# Patient Record
Sex: Male | Born: 1940 | Race: White | Hispanic: No | Smoking: Never smoker
Health system: Southern US, Community
[De-identification: ages and names within clinical notes are randomized; demographics above are authoritative.]

## PROBLEM LIST (undated history)

## (undated) DIAGNOSIS — E785 Hyperlipidemia, unspecified: Secondary | ICD-10-CM

## (undated) DIAGNOSIS — I10 Essential (primary) hypertension: Secondary | ICD-10-CM

## (undated) HISTORY — DX: Hyperlipidemia, unspecified: E78.5

## (undated) HISTORY — DX: Essential (primary) hypertension: I10

---

## 2002-01-24 ENCOUNTER — Emergency Department (HOSPITAL_COMMUNITY): Admission: EM | Admit: 2002-01-24 | Discharge: 2002-01-25 | Payer: Self-pay | Admitting: Emergency Medicine

## 2002-01-25 ENCOUNTER — Encounter: Payer: Self-pay | Admitting: Emergency Medicine

## 2003-02-23 ENCOUNTER — Inpatient Hospital Stay (HOSPITAL_COMMUNITY): Admission: EM | Admit: 2003-02-23 | Discharge: 2003-02-24 | Payer: Self-pay | Admitting: Emergency Medicine

## 2004-09-14 ENCOUNTER — Ambulatory Visit (HOSPITAL_COMMUNITY): Admission: RE | Admit: 2004-09-14 | Discharge: 2004-09-14 | Payer: Self-pay | Admitting: Gastroenterology

## 2013-09-20 ENCOUNTER — Encounter: Payer: Self-pay | Admitting: *Deleted

## 2014-02-26 DIAGNOSIS — Z79899 Other long term (current) drug therapy: Secondary | ICD-10-CM | POA: Diagnosis not present

## 2014-02-26 DIAGNOSIS — H524 Presbyopia: Secondary | ICD-10-CM | POA: Diagnosis not present

## 2014-02-26 DIAGNOSIS — H43393 Other vitreous opacities, bilateral: Secondary | ICD-10-CM | POA: Diagnosis not present

## 2014-02-26 DIAGNOSIS — I10 Essential (primary) hypertension: Secondary | ICD-10-CM | POA: Diagnosis not present

## 2014-02-26 DIAGNOSIS — Z1389 Encounter for screening for other disorder: Secondary | ICD-10-CM | POA: Diagnosis not present

## 2014-02-26 DIAGNOSIS — R739 Hyperglycemia, unspecified: Secondary | ICD-10-CM | POA: Diagnosis not present

## 2014-02-26 DIAGNOSIS — E780 Pure hypercholesterolemia: Secondary | ICD-10-CM | POA: Diagnosis not present

## 2014-02-26 DIAGNOSIS — H2513 Age-related nuclear cataract, bilateral: Secondary | ICD-10-CM | POA: Diagnosis not present

## 2014-02-26 DIAGNOSIS — Z0000 Encounter for general adult medical examination without abnormal findings: Secondary | ICD-10-CM | POA: Diagnosis not present

## 2014-02-26 DIAGNOSIS — H35373 Puckering of macula, bilateral: Secondary | ICD-10-CM | POA: Diagnosis not present

## 2014-04-30 DIAGNOSIS — I10 Essential (primary) hypertension: Secondary | ICD-10-CM | POA: Diagnosis not present

## 2014-06-27 DIAGNOSIS — D1801 Hemangioma of skin and subcutaneous tissue: Secondary | ICD-10-CM | POA: Diagnosis not present

## 2014-06-27 DIAGNOSIS — L570 Actinic keratosis: Secondary | ICD-10-CM | POA: Diagnosis not present

## 2014-06-27 DIAGNOSIS — Z85828 Personal history of other malignant neoplasm of skin: Secondary | ICD-10-CM | POA: Diagnosis not present

## 2014-06-27 DIAGNOSIS — L821 Other seborrheic keratosis: Secondary | ICD-10-CM | POA: Diagnosis not present

## 2014-06-27 DIAGNOSIS — D485 Neoplasm of uncertain behavior of skin: Secondary | ICD-10-CM | POA: Diagnosis not present

## 2015-01-01 DIAGNOSIS — Z85828 Personal history of other malignant neoplasm of skin: Secondary | ICD-10-CM | POA: Diagnosis not present

## 2015-01-01 DIAGNOSIS — D485 Neoplasm of uncertain behavior of skin: Secondary | ICD-10-CM | POA: Diagnosis not present

## 2015-01-01 DIAGNOSIS — L812 Freckles: Secondary | ICD-10-CM | POA: Diagnosis not present

## 2015-01-01 DIAGNOSIS — L570 Actinic keratosis: Secondary | ICD-10-CM | POA: Diagnosis not present

## 2015-01-01 DIAGNOSIS — L821 Other seborrheic keratosis: Secondary | ICD-10-CM | POA: Diagnosis not present

## 2015-01-01 DIAGNOSIS — L820 Inflamed seborrheic keratosis: Secondary | ICD-10-CM | POA: Diagnosis not present

## 2015-01-01 DIAGNOSIS — D1801 Hemangioma of skin and subcutaneous tissue: Secondary | ICD-10-CM | POA: Diagnosis not present

## 2015-01-01 DIAGNOSIS — C4441 Basal cell carcinoma of skin of scalp and neck: Secondary | ICD-10-CM | POA: Diagnosis not present

## 2015-03-13 ENCOUNTER — Emergency Department (HOSPITAL_COMMUNITY)
Admission: EM | Admit: 2015-03-13 | Discharge: 2015-03-13 | Disposition: A | Discharge status: Home / Self Care | Payer: Commercial Managed Care - HMO | Attending: Emergency Medicine | Admitting: Emergency Medicine

## 2015-03-13 ENCOUNTER — Encounter (HOSPITAL_COMMUNITY): Payer: Self-pay | Admitting: Emergency Medicine

## 2015-03-13 DIAGNOSIS — Z7982 Long term (current) use of aspirin: Secondary | ICD-10-CM | POA: Insufficient documentation

## 2015-03-13 DIAGNOSIS — Z8639 Personal history of other endocrine, nutritional and metabolic disease: Secondary | ICD-10-CM | POA: Diagnosis not present

## 2015-03-13 DIAGNOSIS — I10 Essential (primary) hypertension: Secondary | ICD-10-CM | POA: Insufficient documentation

## 2015-03-13 DIAGNOSIS — T464X5A Adverse effect of angiotensin-converting-enzyme inhibitors, initial encounter: Secondary | ICD-10-CM | POA: Insufficient documentation

## 2015-03-13 DIAGNOSIS — Z79899 Other long term (current) drug therapy: Secondary | ICD-10-CM | POA: Insufficient documentation

## 2015-03-13 DIAGNOSIS — T783XXA Angioneurotic edema, initial encounter: Secondary | ICD-10-CM | POA: Insufficient documentation

## 2015-03-13 MED ORDER — FAMOTIDINE IN NACL 20-0.9 MG/50ML-% IV SOLN
20.0000 mg | Freq: Once | INTRAVENOUS | Status: AC
  Administered 2015-03-13: 20 mg via INTRAVENOUS
  Filled 2015-03-13: qty 50

## 2015-03-13 MED ORDER — METHYLPREDNISOLONE SODIUM SUCC 125 MG IJ SOLR
125.0000 mg | Freq: Once | INTRAMUSCULAR | Status: AC
  Administered 2015-03-13: 125 mg via INTRAVENOUS
  Filled 2015-03-13: qty 2

## 2015-03-13 MED ORDER — DIPHENHYDRAMINE HCL 50 MG/ML IJ SOLN
25.0000 mg | Freq: Once | INTRAMUSCULAR | Status: AC
  Administered 2015-03-13: 25 mg via INTRAVENOUS
  Filled 2015-03-13: qty 1

## 2015-03-13 NOTE — ED Notes (Signed)
Pt states he woke up at 4am this morning and his face felt funny  Pt states he thought he had slept funny but when he got up his face was swollen  Pt state he put ice on it but it got worse  Pt states he takes lisinopril

## 2015-03-13 NOTE — ED Notes (Signed)
Pt has taken 2 benadryl prior to visit

## 2015-03-13 NOTE — Discharge Instructions (Signed)
Angioedema  Angioedema is a sudden swelling of tissues, often of the skin. It can occur on the face or genitals or in the abdomen or other body parts. The swelling usually develops over a short period and gets better in 24 to 48 hours. It often begins during the night and is found when the person wakes up. The person may also get red, itchy patches of skin (hives). Angioedema can be dangerous if it involves swelling of the air passages.   Depending on the cause, episodes of angioedema may only happen once, come back in unpredictable patterns, or repeat for several years and then gradually fade away.   CAUSES   Angioedema can be caused by an allergic reaction to various triggers. It can also result from nonallergic causes, including reactions to drugs, immune system disorders, viral infections, or an abnormal gene that is passed to you from your parents (hereditary). For some people with angioedema, the cause is unknown.   Some things that can trigger angioedema include:    Foods.    Medicines, such as ACE inhibitors, ARBs, nonsteroidal anti-inflammatory agents, or estrogen.    Latex.    Animal saliva.    Insect stings.    Dyes used in X-rays.    Mild injury.    Dental work.   Surgery.   Stress.    Sudden changes in temperature.    Exercise.  SIGNS AND SYMPTOMS    Swelling of the skin.   Hives. If these are present, there is also intense itching.   Redness in the affected area.    Pain in the affected area.   Swollen lips or tongue.   Breathing problems. This may happen if the air passages swell.   Wheezing.  If internal organs are involved, there may be:    Nausea.    Abdominal pain.    Vomiting.    Difficulty swallowing.    Difficulty passing urine.  DIAGNOSIS    Your health care provider will examine the affected area and take a medical and family history.   Various tests may be done to help determine the cause. Tests may include:   Allergy skin tests to see if the problem  is an allergic reaction.    Blood tests to check for hereditary angioedema.    Tests to check for underlying diseases that could cause the condition.    A review of your medicines, including over-the-counter medicines, may be done.  TREATMENT   Treatment will depend on the cause of the angioedema. Possible treatments include:    Removal of anything that triggered the condition (such as stopping certain medicines).    Medicines to treat symptoms or prevent attacks. Medicines given may include:     Antihistamines.     Epinephrine injection.     Steroids.    Hospitalization may be required for severe attacks. If the air passages are affected, it can be an emergency. Tubes may need to be placed to keep the airway open.  HOME CARE INSTRUCTIONS    Take all medicines as directed by your health care provider.   If you were given medicines for emergency allergy treatment, always carry them with you.   Wear a medical bracelet as directed by your health care provider.    Avoid known triggers.  SEEK MEDICAL CARE IF:    You have repeat attacks of angioedema.    Your attacks are more frequent or more severe despite preventive measures.    You have hereditary angioedema   and are considering having children. It is important to discuss with your health care provider the risks of passing the condition on to your children.  SEEK IMMEDIATE MEDICAL CARE IF:    You have severe swelling of the mouth, tongue, or lips.   You have difficulty breathing.    You have difficulty swallowing.    You faint.  MAKE SURE YOU:   Understand these instructions.   Will watch your condition.   Will get help right away if you are not doing well or get worse.     This information is not intended to replace advice given to you by your health care provider. Make sure you discuss any questions you have with your health care provider.     Document Released: 03/14/2001 Document Revised: 01/24/2014 Document Reviewed:  08/27/2012  [employer]vier Interactive Patient Education 2016 [employer]

## 2015-03-13 NOTE — ED Provider Notes (Signed)
CSN: [id number]     Arrival date & time 03/13/15  [id number] History   First MD Initiated Contact with Patient 03/13/15 0703     Chief Complaint  Patient presents with  . Angioedema      HPI Pt reports awakening with swelling of the right cheek progressing to swelling of the lips. No difficulty breathing or swallowing. No tongue swelling. Reports tongue swelling several weeks ago that resolved with ice. Has been on lisinopril for several years. No other complaints. Swelling is moderate in severity   Past Medical History  Diagnosis Date  . Hyperlipidemia   . Hypertension    History reviewed. No pertinent past surgical history. No family history on file. Social History  Substance Use Topics  . Smoking status: Never Smoker   . Smokeless tobacco: Never Used  . Alcohol Use: Yes    Review of Systems  All other systems reviewed and are negative.     Allergies  Review of patient's allergies indicates no known allergies.  Home Medications   Prior to Admission medications   Medication Sig Start Date End Date Taking? Authorizing Provider  aspirin 81 MG tablet Take 81 mg by mouth daily.    Historical Provider, MD  famotidine (PEPCID AC) 10 MG chewable tablet Chew 10 mg by mouth as needed for heartburn.    Historical Provider, MD  hydrochlorothiazide (HYDRODIURIL) 25 MG tablet Take 25 mg by mouth daily.    Historical Provider, MD  lisinopril (PRINIVIL,ZESTRIL) 10 MG tablet Take 10 mg by mouth daily.    Historical Provider, MD  Multiple Vitamins-Minerals (MULTIVITAMIN WITH MINERALS) tablet Take 1 tablet by mouth daily.    Historical Provider, MD   BP 156/75 mmHg  Pulse 63  Temp(Src) 97.9 F (36.6 C) (Oral)  Resp 18  Ht 5\' 10"  (1.778 m)  Wt 185 lb (83.915 kg)  BMI 26.54 kg/m2  SpO2 99% Physical Exam  Constitutional: He is oriented to person, place, and time. He appears well-developed and well-nourished.  HENT:  Upper and lower lip swelling with swelling of the right buccal  mucosa. No swelling of the tongue or posterior pharynx. Tolerating secreations  Eyes: EOM are normal.  Neck: Normal range of motion. Neck supple.  Pulmonary/Chest: Effort normal. No stridor. No respiratory distress.  Abdominal: He exhibits no distension.  Musculoskeletal: Normal range of motion.  Neurological: He is alert and oriented to person, place, and time.  Psychiatric: He has a normal mood and affect.  Nursing note and vitals reviewed.   ED Course  Procedures (including critical care time) Labs Review Labs Reviewed - No data to display  Imaging Review No results found. I have personally reviewed and evaluated these images and lab results as part of my medical decision-making.   EKG Interpretation None      MDM   Final diagnoses:  Angioedema, initial encounter    10:53 AM Patient observed in the emergency department for approximately 4 hours.  His swelling has significantly improved.  Discharge home in good condition.  Patient's lisinopril will be discontinued.  A vas that he follow-up with his doctor regarding an additional blood pressure medication.    Jola Schmidt, MD 03/13/15 1054

## 2015-03-13 NOTE — ED Notes (Signed)
Pt complaining of itching around his neck as well.

## 2015-03-16 DIAGNOSIS — I10 Essential (primary) hypertension: Secondary | ICD-10-CM | POA: Diagnosis not present

## 2015-03-16 DIAGNOSIS — T783XXA Angioneurotic edema, initial encounter: Secondary | ICD-10-CM | POA: Diagnosis not present

## 2015-05-06 DIAGNOSIS — I10 Essential (primary) hypertension: Secondary | ICD-10-CM | POA: Diagnosis not present

## 2015-05-06 DIAGNOSIS — Z79899 Other long term (current) drug therapy: Secondary | ICD-10-CM | POA: Diagnosis not present

## 2015-05-06 DIAGNOSIS — Z0000 Encounter for general adult medical examination without abnormal findings: Secondary | ICD-10-CM | POA: Diagnosis not present

## 2015-05-06 DIAGNOSIS — M545 Low back pain: Secondary | ICD-10-CM | POA: Diagnosis not present

## 2015-05-06 DIAGNOSIS — E7800 Pure hypercholesterolemia, unspecified: Secondary | ICD-10-CM | POA: Diagnosis not present

## 2015-05-06 DIAGNOSIS — G8929 Other chronic pain: Secondary | ICD-10-CM | POA: Diagnosis not present

## 2015-05-06 DIAGNOSIS — Z1389 Encounter for screening for other disorder: Secondary | ICD-10-CM | POA: Diagnosis not present

## 2015-06-29 DIAGNOSIS — D485 Neoplasm of uncertain behavior of skin: Secondary | ICD-10-CM | POA: Diagnosis not present

## 2015-06-29 DIAGNOSIS — L820 Inflamed seborrheic keratosis: Secondary | ICD-10-CM | POA: Diagnosis not present

## 2015-06-29 DIAGNOSIS — C44519 Basal cell carcinoma of skin of other part of trunk: Secondary | ICD-10-CM | POA: Diagnosis not present

## 2015-06-29 DIAGNOSIS — L821 Other seborrheic keratosis: Secondary | ICD-10-CM | POA: Diagnosis not present

## 2015-06-29 DIAGNOSIS — L570 Actinic keratosis: Secondary | ICD-10-CM | POA: Diagnosis not present

## 2015-06-29 DIAGNOSIS — Z85828 Personal history of other malignant neoplasm of skin: Secondary | ICD-10-CM | POA: Diagnosis not present

## 2015-12-28 DIAGNOSIS — L812 Freckles: Secondary | ICD-10-CM | POA: Diagnosis not present

## 2015-12-28 DIAGNOSIS — D1801 Hemangioma of skin and subcutaneous tissue: Secondary | ICD-10-CM | POA: Diagnosis not present

## 2015-12-28 DIAGNOSIS — D225 Melanocytic nevi of trunk: Secondary | ICD-10-CM | POA: Diagnosis not present

## 2015-12-28 DIAGNOSIS — L820 Inflamed seborrheic keratosis: Secondary | ICD-10-CM | POA: Diagnosis not present

## 2015-12-28 DIAGNOSIS — L570 Actinic keratosis: Secondary | ICD-10-CM | POA: Diagnosis not present

## 2015-12-28 DIAGNOSIS — L821 Other seborrheic keratosis: Secondary | ICD-10-CM | POA: Diagnosis not present

## 2015-12-28 DIAGNOSIS — Z85828 Personal history of other malignant neoplasm of skin: Secondary | ICD-10-CM | POA: Diagnosis not present

## 2016-03-30 DIAGNOSIS — H25013 Cortical age-related cataract, bilateral: Secondary | ICD-10-CM | POA: Diagnosis not present

## 2016-03-30 DIAGNOSIS — H2513 Age-related nuclear cataract, bilateral: Secondary | ICD-10-CM | POA: Diagnosis not present

## 2016-03-30 DIAGNOSIS — H524 Presbyopia: Secondary | ICD-10-CM | POA: Diagnosis not present

## 2016-03-30 DIAGNOSIS — H35373 Puckering of macula, bilateral: Secondary | ICD-10-CM | POA: Diagnosis not present

## 2016-03-30 DIAGNOSIS — H53031 Strabismic amblyopia, right eye: Secondary | ICD-10-CM | POA: Diagnosis not present

## 2016-05-20 DIAGNOSIS — Z0000 Encounter for general adult medical examination without abnormal findings: Secondary | ICD-10-CM | POA: Diagnosis not present

## 2016-05-20 DIAGNOSIS — Z79899 Other long term (current) drug therapy: Secondary | ICD-10-CM | POA: Diagnosis not present

## 2016-05-20 DIAGNOSIS — Z1389 Encounter for screening for other disorder: Secondary | ICD-10-CM | POA: Diagnosis not present

## 2016-05-20 DIAGNOSIS — E7800 Pure hypercholesterolemia, unspecified: Secondary | ICD-10-CM | POA: Diagnosis not present

## 2016-05-20 DIAGNOSIS — I10 Essential (primary) hypertension: Secondary | ICD-10-CM | POA: Diagnosis not present

## 2016-05-20 DIAGNOSIS — R972 Elevated prostate specific antigen [PSA]: Secondary | ICD-10-CM | POA: Diagnosis not present

## 2016-06-20 DIAGNOSIS — L821 Other seborrheic keratosis: Secondary | ICD-10-CM | POA: Diagnosis not present

## 2016-06-20 DIAGNOSIS — D485 Neoplasm of uncertain behavior of skin: Secondary | ICD-10-CM | POA: Diagnosis not present

## 2016-06-20 DIAGNOSIS — L570 Actinic keratosis: Secondary | ICD-10-CM | POA: Diagnosis not present

## 2016-06-20 DIAGNOSIS — L812 Freckles: Secondary | ICD-10-CM | POA: Diagnosis not present

## 2016-06-20 DIAGNOSIS — L820 Inflamed seborrheic keratosis: Secondary | ICD-10-CM | POA: Diagnosis not present

## 2016-06-20 DIAGNOSIS — D1801 Hemangioma of skin and subcutaneous tissue: Secondary | ICD-10-CM | POA: Diagnosis not present

## 2016-06-20 DIAGNOSIS — Z85828 Personal history of other malignant neoplasm of skin: Secondary | ICD-10-CM | POA: Diagnosis not present

## 2016-06-20 DIAGNOSIS — C44319 Basal cell carcinoma of skin of other parts of face: Secondary | ICD-10-CM | POA: Diagnosis not present

## 2016-06-20 DIAGNOSIS — C44311 Basal cell carcinoma of skin of nose: Secondary | ICD-10-CM | POA: Diagnosis not present

## 2016-08-09 DIAGNOSIS — N401 Enlarged prostate with lower urinary tract symptoms: Secondary | ICD-10-CM | POA: Diagnosis not present

## 2016-08-09 DIAGNOSIS — R351 Nocturia: Secondary | ICD-10-CM | POA: Diagnosis not present

## 2016-08-09 DIAGNOSIS — R350 Frequency of micturition: Secondary | ICD-10-CM | POA: Diagnosis not present

## 2016-08-09 DIAGNOSIS — R3915 Urgency of urination: Secondary | ICD-10-CM | POA: Diagnosis not present

## 2016-08-09 DIAGNOSIS — N400 Enlarged prostate without lower urinary tract symptoms: Secondary | ICD-10-CM | POA: Diagnosis not present

## 2016-08-09 DIAGNOSIS — N138 Other obstructive and reflux uropathy: Secondary | ICD-10-CM | POA: Diagnosis not present

## 2016-08-09 DIAGNOSIS — R972 Elevated prostate specific antigen [PSA]: Secondary | ICD-10-CM | POA: Diagnosis not present

## 2016-08-18 DIAGNOSIS — H8113 Benign paroxysmal vertigo, bilateral: Secondary | ICD-10-CM | POA: Diagnosis not present

## 2017-01-18 DIAGNOSIS — I10 Essential (primary) hypertension: Secondary | ICD-10-CM | POA: Diagnosis not present

## 2017-01-18 DIAGNOSIS — Z23 Encounter for immunization: Secondary | ICD-10-CM | POA: Diagnosis not present

## 2017-01-18 DIAGNOSIS — S39012A Strain of muscle, fascia and tendon of lower back, initial encounter: Secondary | ICD-10-CM | POA: Diagnosis not present

## 2017-02-14 DIAGNOSIS — R972 Elevated prostate specific antigen [PSA]: Secondary | ICD-10-CM | POA: Diagnosis not present

## 2017-02-14 DIAGNOSIS — N400 Enlarged prostate without lower urinary tract symptoms: Secondary | ICD-10-CM | POA: Diagnosis not present

## 2017-02-14 DIAGNOSIS — K4090 Unilateral inguinal hernia, without obstruction or gangrene, not specified as recurrent: Secondary | ICD-10-CM | POA: Diagnosis not present

## 2017-03-10 DIAGNOSIS — M6208 Separation of muscle (nontraumatic), other site: Secondary | ICD-10-CM | POA: Diagnosis not present

## 2017-03-10 DIAGNOSIS — K4090 Unilateral inguinal hernia, without obstruction or gangrene, not specified as recurrent: Secondary | ICD-10-CM | POA: Diagnosis not present

## 2017-03-31 DIAGNOSIS — H35373 Puckering of macula, bilateral: Secondary | ICD-10-CM | POA: Diagnosis not present

## 2017-03-31 DIAGNOSIS — H2513 Age-related nuclear cataract, bilateral: Secondary | ICD-10-CM | POA: Diagnosis not present

## 2017-03-31 DIAGNOSIS — H25013 Cortical age-related cataract, bilateral: Secondary | ICD-10-CM | POA: Diagnosis not present

## 2017-04-27 DIAGNOSIS — K4090 Unilateral inguinal hernia, without obstruction or gangrene, not specified as recurrent: Secondary | ICD-10-CM | POA: Diagnosis not present

## 2017-05-31 DIAGNOSIS — Z1389 Encounter for screening for other disorder: Secondary | ICD-10-CM | POA: Diagnosis not present

## 2017-05-31 DIAGNOSIS — R739 Hyperglycemia, unspecified: Secondary | ICD-10-CM | POA: Diagnosis not present

## 2017-05-31 DIAGNOSIS — K219 Gastro-esophageal reflux disease without esophagitis: Secondary | ICD-10-CM | POA: Diagnosis not present

## 2017-05-31 DIAGNOSIS — I10 Essential (primary) hypertension: Secondary | ICD-10-CM | POA: Diagnosis not present

## 2017-05-31 DIAGNOSIS — E7800 Pure hypercholesterolemia, unspecified: Secondary | ICD-10-CM | POA: Diagnosis not present

## 2017-05-31 DIAGNOSIS — Z79899 Other long term (current) drug therapy: Secondary | ICD-10-CM | POA: Diagnosis not present

## 2017-05-31 DIAGNOSIS — Z0000 Encounter for general adult medical examination without abnormal findings: Secondary | ICD-10-CM | POA: Diagnosis not present

## 2017-08-15 DIAGNOSIS — N138 Other obstructive and reflux uropathy: Secondary | ICD-10-CM | POA: Diagnosis not present

## 2017-08-15 DIAGNOSIS — N401 Enlarged prostate with lower urinary tract symptoms: Secondary | ICD-10-CM | POA: Diagnosis not present

## 2017-08-15 DIAGNOSIS — N400 Enlarged prostate without lower urinary tract symptoms: Secondary | ICD-10-CM | POA: Diagnosis not present

## 2017-08-15 DIAGNOSIS — I10 Essential (primary) hypertension: Secondary | ICD-10-CM | POA: Diagnosis not present

## 2017-08-15 DIAGNOSIS — R972 Elevated prostate specific antigen [PSA]: Secondary | ICD-10-CM | POA: Diagnosis not present

## 2017-10-03 DIAGNOSIS — H2511 Age-related nuclear cataract, right eye: Secondary | ICD-10-CM | POA: Diagnosis not present

## 2017-10-03 DIAGNOSIS — H2513 Age-related nuclear cataract, bilateral: Secondary | ICD-10-CM | POA: Diagnosis not present

## 2017-10-03 DIAGNOSIS — H35371 Puckering of macula, right eye: Secondary | ICD-10-CM | POA: Diagnosis not present

## 2017-10-03 DIAGNOSIS — H35372 Puckering of macula, left eye: Secondary | ICD-10-CM | POA: Diagnosis not present

## 2017-10-03 DIAGNOSIS — H25013 Cortical age-related cataract, bilateral: Secondary | ICD-10-CM | POA: Diagnosis not present

## 2017-10-03 DIAGNOSIS — H35373 Puckering of macula, bilateral: Secondary | ICD-10-CM | POA: Diagnosis not present

## 2017-10-25 DIAGNOSIS — H2511 Age-related nuclear cataract, right eye: Secondary | ICD-10-CM | POA: Diagnosis not present

## 2017-10-25 DIAGNOSIS — H25811 Combined forms of age-related cataract, right eye: Secondary | ICD-10-CM | POA: Diagnosis not present

## 2017-11-23 DIAGNOSIS — H2512 Age-related nuclear cataract, left eye: Secondary | ICD-10-CM | POA: Diagnosis not present

## 2017-11-23 DIAGNOSIS — H25012 Cortical age-related cataract, left eye: Secondary | ICD-10-CM | POA: Diagnosis not present

## 2017-12-27 DIAGNOSIS — H2512 Age-related nuclear cataract, left eye: Secondary | ICD-10-CM | POA: Diagnosis not present

## 2017-12-27 DIAGNOSIS — H25812 Combined forms of age-related cataract, left eye: Secondary | ICD-10-CM | POA: Diagnosis not present

## 2018-09-27 DIAGNOSIS — L0232 Furuncle of buttock: Secondary | ICD-10-CM | POA: Diagnosis not present

## 2018-09-27 DIAGNOSIS — I10 Essential (primary) hypertension: Secondary | ICD-10-CM | POA: Diagnosis not present

## 2018-10-08 DIAGNOSIS — I10 Essential (primary) hypertension: Secondary | ICD-10-CM | POA: Diagnosis not present

## 2018-10-08 DIAGNOSIS — Z1389 Encounter for screening for other disorder: Secondary | ICD-10-CM | POA: Diagnosis not present

## 2018-10-08 DIAGNOSIS — L0232 Furuncle of buttock: Secondary | ICD-10-CM | POA: Diagnosis not present

## 2018-11-26 ENCOUNTER — Other Ambulatory Visit: Payer: Self-pay

## 2018-11-26 DIAGNOSIS — Z20822 Contact with and (suspected) exposure to covid-19: Secondary | ICD-10-CM

## 2018-11-28 LAB — NOVEL CORONAVIRUS, NAA: SARS-CoV-2, NAA: DETECTED — AB

## 2018-12-04 DIAGNOSIS — U071 COVID-19: Secondary | ICD-10-CM | POA: Diagnosis not present

## 2019-02-15 DIAGNOSIS — Z79899 Other long term (current) drug therapy: Secondary | ICD-10-CM | POA: Diagnosis not present

## 2019-02-15 DIAGNOSIS — I872 Venous insufficiency (chronic) (peripheral): Secondary | ICD-10-CM | POA: Diagnosis not present

## 2019-02-15 DIAGNOSIS — E7800 Pure hypercholesterolemia, unspecified: Secondary | ICD-10-CM | POA: Diagnosis not present

## 2019-02-15 DIAGNOSIS — K219 Gastro-esophageal reflux disease without esophagitis: Secondary | ICD-10-CM | POA: Diagnosis not present

## 2019-02-15 DIAGNOSIS — Z1389 Encounter for screening for other disorder: Secondary | ICD-10-CM | POA: Diagnosis not present

## 2019-02-15 DIAGNOSIS — I10 Essential (primary) hypertension: Secondary | ICD-10-CM | POA: Diagnosis not present

## 2019-02-15 DIAGNOSIS — Z0000 Encounter for general adult medical examination without abnormal findings: Secondary | ICD-10-CM | POA: Diagnosis not present

## 2019-02-15 DIAGNOSIS — H903 Sensorineural hearing loss, bilateral: Secondary | ICD-10-CM | POA: Diagnosis not present

## 2019-03-13 DIAGNOSIS — Z822 Family history of deafness and hearing loss: Secondary | ICD-10-CM | POA: Diagnosis not present

## 2019-03-13 DIAGNOSIS — Z570 Occupational exposure to noise: Secondary | ICD-10-CM | POA: Diagnosis not present

## 2019-03-13 DIAGNOSIS — H903 Sensorineural hearing loss, bilateral: Secondary | ICD-10-CM | POA: Diagnosis not present

## 2019-05-30 ENCOUNTER — Ambulatory Visit (HOSPITAL_COMMUNITY)
Admission: RE | Admit: 2019-05-30 | Discharge: 2019-05-30 | Disposition: A | Discharge status: Home / Self Care | Payer: Medicare HMO | Source: Ambulatory Visit | Attending: Geriatric Medicine | Admitting: Geriatric Medicine

## 2019-05-30 ENCOUNTER — Other Ambulatory Visit (HOSPITAL_COMMUNITY): Payer: Self-pay | Admitting: Geriatric Medicine

## 2019-05-30 ENCOUNTER — Other Ambulatory Visit: Payer: Self-pay

## 2019-05-30 DIAGNOSIS — M79604 Pain in right leg: Secondary | ICD-10-CM | POA: Insufficient documentation

## 2019-05-30 DIAGNOSIS — M79661 Pain in right lower leg: Secondary | ICD-10-CM | POA: Diagnosis not present

## 2019-05-30 DIAGNOSIS — M7989 Other specified soft tissue disorders: Secondary | ICD-10-CM | POA: Diagnosis not present

## 2019-05-30 DIAGNOSIS — I10 Essential (primary) hypertension: Secondary | ICD-10-CM | POA: Diagnosis not present

## 2019-05-30 NOTE — Progress Notes (Signed)
Lower extremity venous has been completed.   Preliminary results in CV Proc.   Dwayne Farrell 05/30/2019 3:42 PM

## 2019-12-31 DIAGNOSIS — I10 Essential (primary) hypertension: Secondary | ICD-10-CM | POA: Diagnosis not present

## 2019-12-31 DIAGNOSIS — I872 Venous insufficiency (chronic) (peripheral): Secondary | ICD-10-CM | POA: Diagnosis not present

## 2019-12-31 DIAGNOSIS — Z79899 Other long term (current) drug therapy: Secondary | ICD-10-CM | POA: Diagnosis not present

## 2019-12-31 DIAGNOSIS — Z23 Encounter for immunization: Secondary | ICD-10-CM | POA: Diagnosis not present

## 2020-01-28 DIAGNOSIS — R7309 Other abnormal glucose: Secondary | ICD-10-CM | POA: Diagnosis not present

## 2020-03-02 DIAGNOSIS — M25562 Pain in left knee: Secondary | ICD-10-CM | POA: Diagnosis not present

## 2020-03-02 DIAGNOSIS — Z1389 Encounter for screening for other disorder: Secondary | ICD-10-CM | POA: Diagnosis not present

## 2020-03-02 DIAGNOSIS — K219 Gastro-esophageal reflux disease without esophagitis: Secondary | ICD-10-CM | POA: Diagnosis not present

## 2020-03-02 DIAGNOSIS — R7303 Prediabetes: Secondary | ICD-10-CM | POA: Diagnosis not present

## 2020-03-02 DIAGNOSIS — I10 Essential (primary) hypertension: Secondary | ICD-10-CM | POA: Diagnosis not present

## 2020-03-02 DIAGNOSIS — Z79899 Other long term (current) drug therapy: Secondary | ICD-10-CM | POA: Diagnosis not present

## 2020-03-02 DIAGNOSIS — Z0000 Encounter for general adult medical examination without abnormal findings: Secondary | ICD-10-CM | POA: Diagnosis not present

## 2020-03-02 DIAGNOSIS — Z1159 Encounter for screening for other viral diseases: Secondary | ICD-10-CM | POA: Diagnosis not present

## 2020-03-02 DIAGNOSIS — E7800 Pure hypercholesterolemia, unspecified: Secondary | ICD-10-CM | POA: Diagnosis not present

## 2020-03-04 DIAGNOSIS — M25552 Pain in left hip: Secondary | ICD-10-CM | POA: Diagnosis not present

## 2020-03-04 DIAGNOSIS — M25562 Pain in left knee: Secondary | ICD-10-CM | POA: Diagnosis not present

## 2020-03-18 DIAGNOSIS — K006 Disturbances in tooth eruption: Secondary | ICD-10-CM | POA: Diagnosis not present

## 2020-04-01 DIAGNOSIS — M25552 Pain in left hip: Secondary | ICD-10-CM | POA: Diagnosis not present

## 2020-06-03 DIAGNOSIS — M25562 Pain in left knee: Secondary | ICD-10-CM | POA: Diagnosis not present

## 2020-06-29 DIAGNOSIS — R69 Illness, unspecified: Secondary | ICD-10-CM | POA: Diagnosis not present

## 2020-12-07 DIAGNOSIS — M25562 Pain in left knee: Secondary | ICD-10-CM | POA: Diagnosis not present

## 2021-02-12 DIAGNOSIS — S83241A Other tear of medial meniscus, current injury, right knee, initial encounter: Secondary | ICD-10-CM | POA: Diagnosis not present

## 2021-02-17 DIAGNOSIS — S83241D Other tear of medial meniscus, current injury, right knee, subsequent encounter: Secondary | ICD-10-CM | POA: Diagnosis not present

## 2021-02-20 DIAGNOSIS — M25562 Pain in left knee: Secondary | ICD-10-CM | POA: Diagnosis not present

## 2021-02-20 DIAGNOSIS — M25561 Pain in right knee: Secondary | ICD-10-CM | POA: Diagnosis not present

## 2021-02-24 DIAGNOSIS — M25562 Pain in left knee: Secondary | ICD-10-CM | POA: Diagnosis not present

## 2021-05-05 DIAGNOSIS — E7800 Pure hypercholesterolemia, unspecified: Secondary | ICD-10-CM | POA: Diagnosis not present

## 2021-05-05 DIAGNOSIS — I872 Venous insufficiency (chronic) (peripheral): Secondary | ICD-10-CM | POA: Diagnosis not present

## 2021-05-05 DIAGNOSIS — R972 Elevated prostate specific antigen [PSA]: Secondary | ICD-10-CM | POA: Diagnosis not present

## 2021-05-05 DIAGNOSIS — M25561 Pain in right knee: Secondary | ICD-10-CM | POA: Diagnosis not present

## 2021-05-05 DIAGNOSIS — Z1389 Encounter for screening for other disorder: Secondary | ICD-10-CM | POA: Diagnosis not present

## 2021-05-05 DIAGNOSIS — R7303 Prediabetes: Secondary | ICD-10-CM | POA: Diagnosis not present

## 2021-05-05 DIAGNOSIS — Z0000 Encounter for general adult medical examination without abnormal findings: Secondary | ICD-10-CM | POA: Diagnosis not present

## 2021-05-05 DIAGNOSIS — I10 Essential (primary) hypertension: Secondary | ICD-10-CM | POA: Diagnosis not present

## 2021-05-05 DIAGNOSIS — Z79899 Other long term (current) drug therapy: Secondary | ICD-10-CM | POA: Diagnosis not present

## 2021-06-03 DIAGNOSIS — M94261 Chondromalacia, right knee: Secondary | ICD-10-CM | POA: Diagnosis not present

## 2021-06-03 DIAGNOSIS — G8918 Other acute postprocedural pain: Secondary | ICD-10-CM | POA: Diagnosis not present

## 2021-06-03 DIAGNOSIS — S83271A Complex tear of lateral meniscus, current injury, right knee, initial encounter: Secondary | ICD-10-CM | POA: Diagnosis not present

## 2021-06-03 DIAGNOSIS — M6751 Plica syndrome, right knee: Secondary | ICD-10-CM | POA: Diagnosis not present

## 2021-06-03 DIAGNOSIS — S83241A Other tear of medial meniscus, current injury, right knee, initial encounter: Secondary | ICD-10-CM | POA: Diagnosis not present

## 2021-06-03 DIAGNOSIS — S83281A Other tear of lateral meniscus, current injury, right knee, initial encounter: Secondary | ICD-10-CM | POA: Diagnosis not present

## 2021-06-03 DIAGNOSIS — S83231A Complex tear of medial meniscus, current injury, right knee, initial encounter: Secondary | ICD-10-CM | POA: Diagnosis not present

## 2021-06-08 DIAGNOSIS — S83271D Complex tear of lateral meniscus, current injury, right knee, subsequent encounter: Secondary | ICD-10-CM | POA: Diagnosis not present

## 2021-06-08 DIAGNOSIS — S83231D Complex tear of medial meniscus, current injury, right knee, subsequent encounter: Secondary | ICD-10-CM | POA: Diagnosis not present

## 2021-06-16 DIAGNOSIS — S83231D Complex tear of medial meniscus, current injury, right knee, subsequent encounter: Secondary | ICD-10-CM | POA: Diagnosis not present

## 2021-06-17 DIAGNOSIS — S83271D Complex tear of lateral meniscus, current injury, right knee, subsequent encounter: Secondary | ICD-10-CM | POA: Diagnosis not present

## 2021-06-17 DIAGNOSIS — S83231D Complex tear of medial meniscus, current injury, right knee, subsequent encounter: Secondary | ICD-10-CM | POA: Diagnosis not present

## 2021-06-24 DIAGNOSIS — S83231D Complex tear of medial meniscus, current injury, right knee, subsequent encounter: Secondary | ICD-10-CM | POA: Diagnosis not present

## 2021-06-24 DIAGNOSIS — S83271D Complex tear of lateral meniscus, current injury, right knee, subsequent encounter: Secondary | ICD-10-CM | POA: Diagnosis not present

## 2021-07-13 DIAGNOSIS — H35033 Hypertensive retinopathy, bilateral: Secondary | ICD-10-CM | POA: Diagnosis not present

## 2021-07-13 DIAGNOSIS — H35371 Puckering of macula, right eye: Secondary | ICD-10-CM | POA: Diagnosis not present

## 2021-07-13 DIAGNOSIS — H26492 Other secondary cataract, left eye: Secondary | ICD-10-CM | POA: Diagnosis not present

## 2021-07-13 DIAGNOSIS — Z961 Presence of intraocular lens: Secondary | ICD-10-CM | POA: Diagnosis not present

## 2022-08-22 DIAGNOSIS — I872 Venous insufficiency (chronic) (peripheral): Secondary | ICD-10-CM | POA: Diagnosis not present

## 2022-08-22 DIAGNOSIS — E7800 Pure hypercholesterolemia, unspecified: Secondary | ICD-10-CM | POA: Diagnosis not present

## 2022-08-22 DIAGNOSIS — Z23 Encounter for immunization: Secondary | ICD-10-CM | POA: Diagnosis not present

## 2022-08-22 DIAGNOSIS — Z0000 Encounter for general adult medical examination without abnormal findings: Secondary | ICD-10-CM | POA: Diagnosis not present

## 2022-08-22 DIAGNOSIS — M79605 Pain in left leg: Secondary | ICD-10-CM | POA: Diagnosis not present

## 2022-08-22 DIAGNOSIS — R7303 Prediabetes: Secondary | ICD-10-CM | POA: Diagnosis not present

## 2022-08-22 DIAGNOSIS — Z79899 Other long term (current) drug therapy: Secondary | ICD-10-CM | POA: Diagnosis not present

## 2022-08-22 DIAGNOSIS — M79604 Pain in right leg: Secondary | ICD-10-CM | POA: Diagnosis not present

## 2022-08-22 DIAGNOSIS — I10 Essential (primary) hypertension: Secondary | ICD-10-CM | POA: Diagnosis not present

## 2022-08-22 DIAGNOSIS — Z9181 History of falling: Secondary | ICD-10-CM | POA: Diagnosis not present

## 2022-08-22 DIAGNOSIS — Z1331 Encounter for screening for depression: Secondary | ICD-10-CM | POA: Diagnosis not present

## 2022-08-22 DIAGNOSIS — M25561 Pain in right knee: Secondary | ICD-10-CM | POA: Diagnosis not present

## 2022-09-05 DIAGNOSIS — I10 Essential (primary) hypertension: Secondary | ICD-10-CM | POA: Diagnosis not present

## 2022-11-30 DIAGNOSIS — R220 Localized swelling, mass and lump, head: Secondary | ICD-10-CM | POA: Diagnosis not present

## 2023-07-20 DIAGNOSIS — Z85828 Personal history of other malignant neoplasm of skin: Secondary | ICD-10-CM | POA: Diagnosis not present

## 2023-07-20 DIAGNOSIS — L821 Other seborrheic keratosis: Secondary | ICD-10-CM | POA: Diagnosis not present

## 2023-07-20 DIAGNOSIS — L812 Freckles: Secondary | ICD-10-CM | POA: Diagnosis not present

## 2023-07-20 DIAGNOSIS — D1801 Hemangioma of skin and subcutaneous tissue: Secondary | ICD-10-CM | POA: Diagnosis not present

## 2023-07-20 DIAGNOSIS — D485 Neoplasm of uncertain behavior of skin: Secondary | ICD-10-CM | POA: Diagnosis not present

## 2023-07-20 DIAGNOSIS — D0439 Carcinoma in situ of skin of other parts of face: Secondary | ICD-10-CM | POA: Diagnosis not present

## 2023-07-20 DIAGNOSIS — C44529 Squamous cell carcinoma of skin of other part of trunk: Secondary | ICD-10-CM | POA: Diagnosis not present

## 2023-07-20 DIAGNOSIS — C44519 Basal cell carcinoma of skin of other part of trunk: Secondary | ICD-10-CM | POA: Diagnosis not present

## 2023-07-20 DIAGNOSIS — L570 Actinic keratosis: Secondary | ICD-10-CM | POA: Diagnosis not present

## 2023-08-17 DIAGNOSIS — I10 Essential (primary) hypertension: Secondary | ICD-10-CM | POA: Diagnosis not present

## 2023-08-17 DIAGNOSIS — E7800 Pure hypercholesterolemia, unspecified: Secondary | ICD-10-CM | POA: Diagnosis not present

## 2023-10-16 DIAGNOSIS — T63481A Toxic effect of venom of other arthropod, accidental (unintentional), initial encounter: Secondary | ICD-10-CM | POA: Diagnosis not present

## 2023-10-16 DIAGNOSIS — L0390 Cellulitis, unspecified: Secondary | ICD-10-CM | POA: Diagnosis not present

## 2023-11-27 DIAGNOSIS — C44321 Squamous cell carcinoma of skin of nose: Secondary | ICD-10-CM | POA: Diagnosis not present

## 2023-11-27 DIAGNOSIS — Z85828 Personal history of other malignant neoplasm of skin: Secondary | ICD-10-CM | POA: Diagnosis not present
